# Patient Record
Sex: Male | Born: 1985 | Race: White | Hispanic: No | Marital: Single | Smoking: Current every day smoker
Health system: Southern US, Community
[De-identification: ages and names within clinical notes are randomized; demographics above are authoritative.]

## PROBLEM LIST (undated history)

## (undated) HISTORY — PX: TONSILLECTOMY AND ADENOIDECTOMY: SUR1326

---
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
## 2007-02-16 ENCOUNTER — Encounter: Payer: Self-pay | Admitting: Orthopedic Surgery

## 2007-03-13 ENCOUNTER — Emergency Department (HOSPITAL_COMMUNITY): Admission: EM | Admit: 2007-03-13 | Discharge: 2007-03-13 | Payer: Self-pay | Admitting: Emergency Medicine

## 2007-03-17 ENCOUNTER — Ambulatory Visit: Payer: Self-pay | Admitting: Orthopedic Surgery

## 2007-03-17 DIAGNOSIS — S62329A Displaced fracture of shaft of unspecified metacarpal bone, initial encounter for closed fracture: Secondary | ICD-10-CM | POA: Insufficient documentation

## 2007-04-14 ENCOUNTER — Ambulatory Visit: Payer: Self-pay | Admitting: Orthopedic Surgery

## 2009-08-25 IMAGING — CR DG HAND COMPLETE 3+V*R*
3 series · 3 of 3 positions shown · non-contrast
Comparison: None.

CLINICAL DATA: Punched wall ? pain and swelling 5th metacarpal.
 RIGHT HAND ? 3 VIEW:

[view not recorded (1 of 3)]
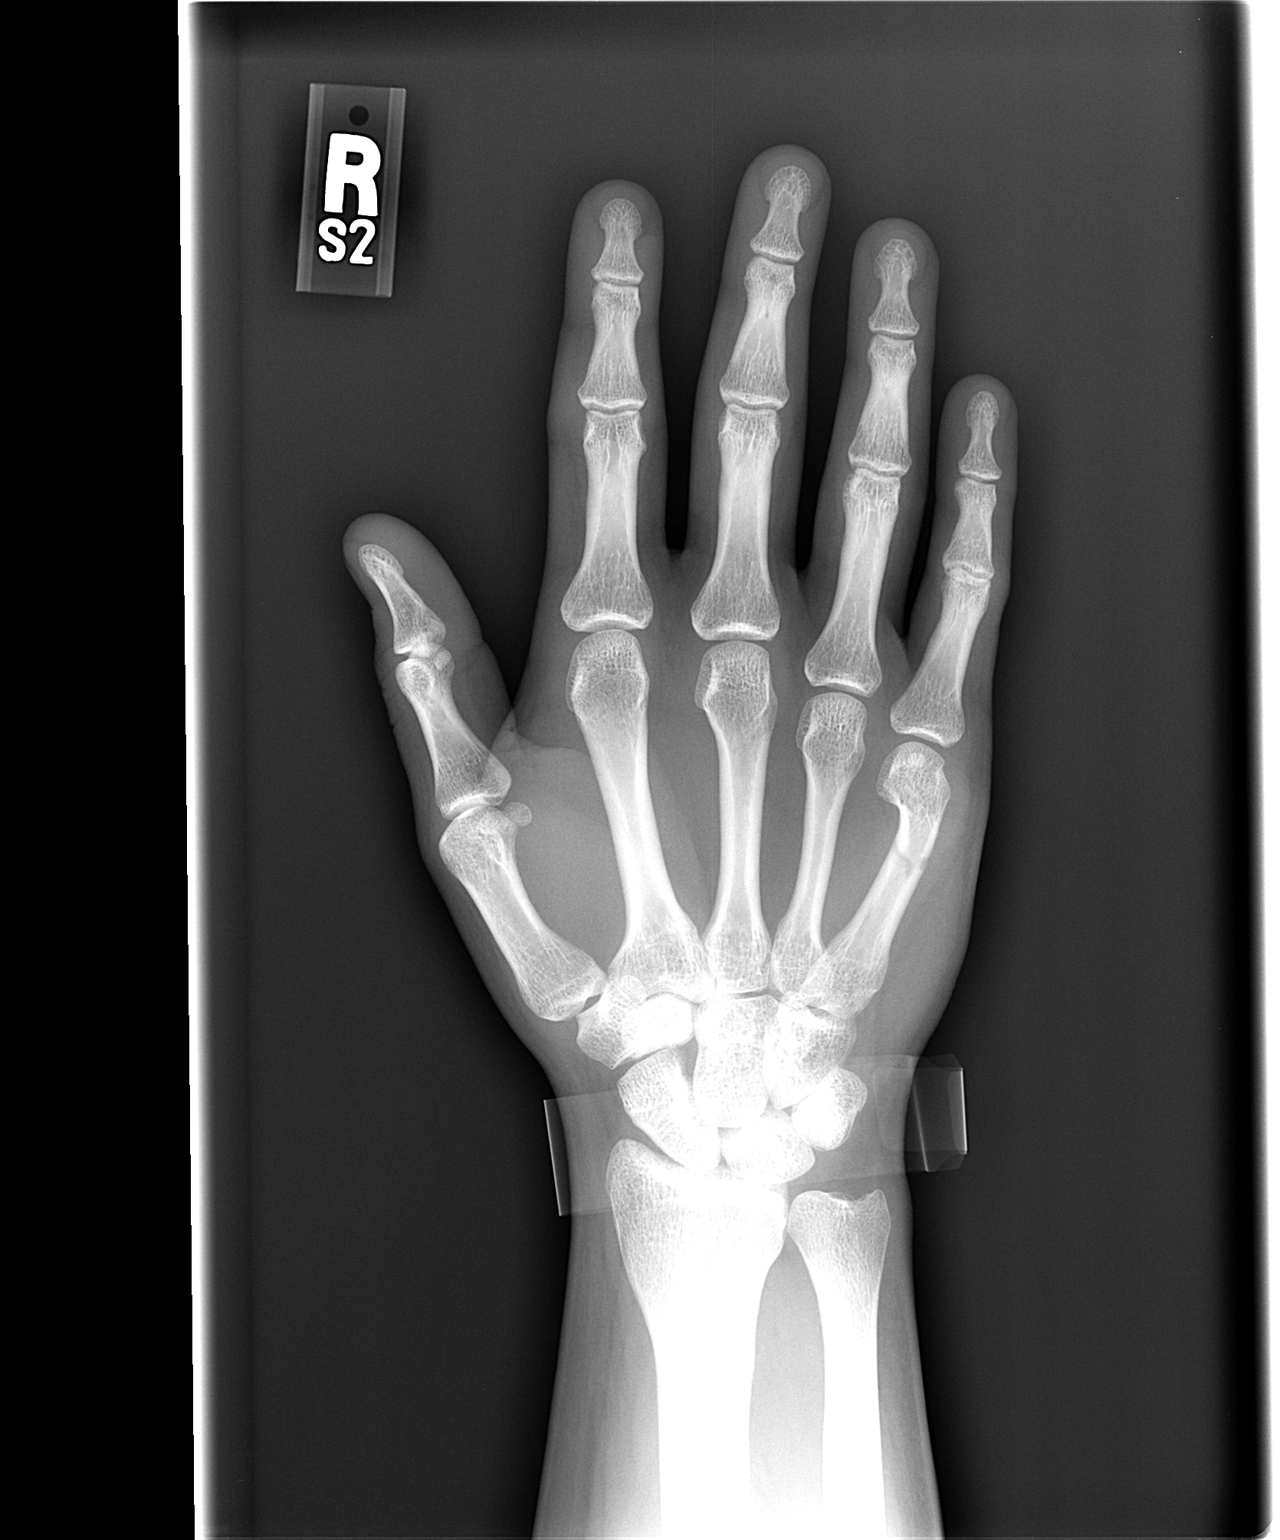

[view not recorded (2 of 3)]
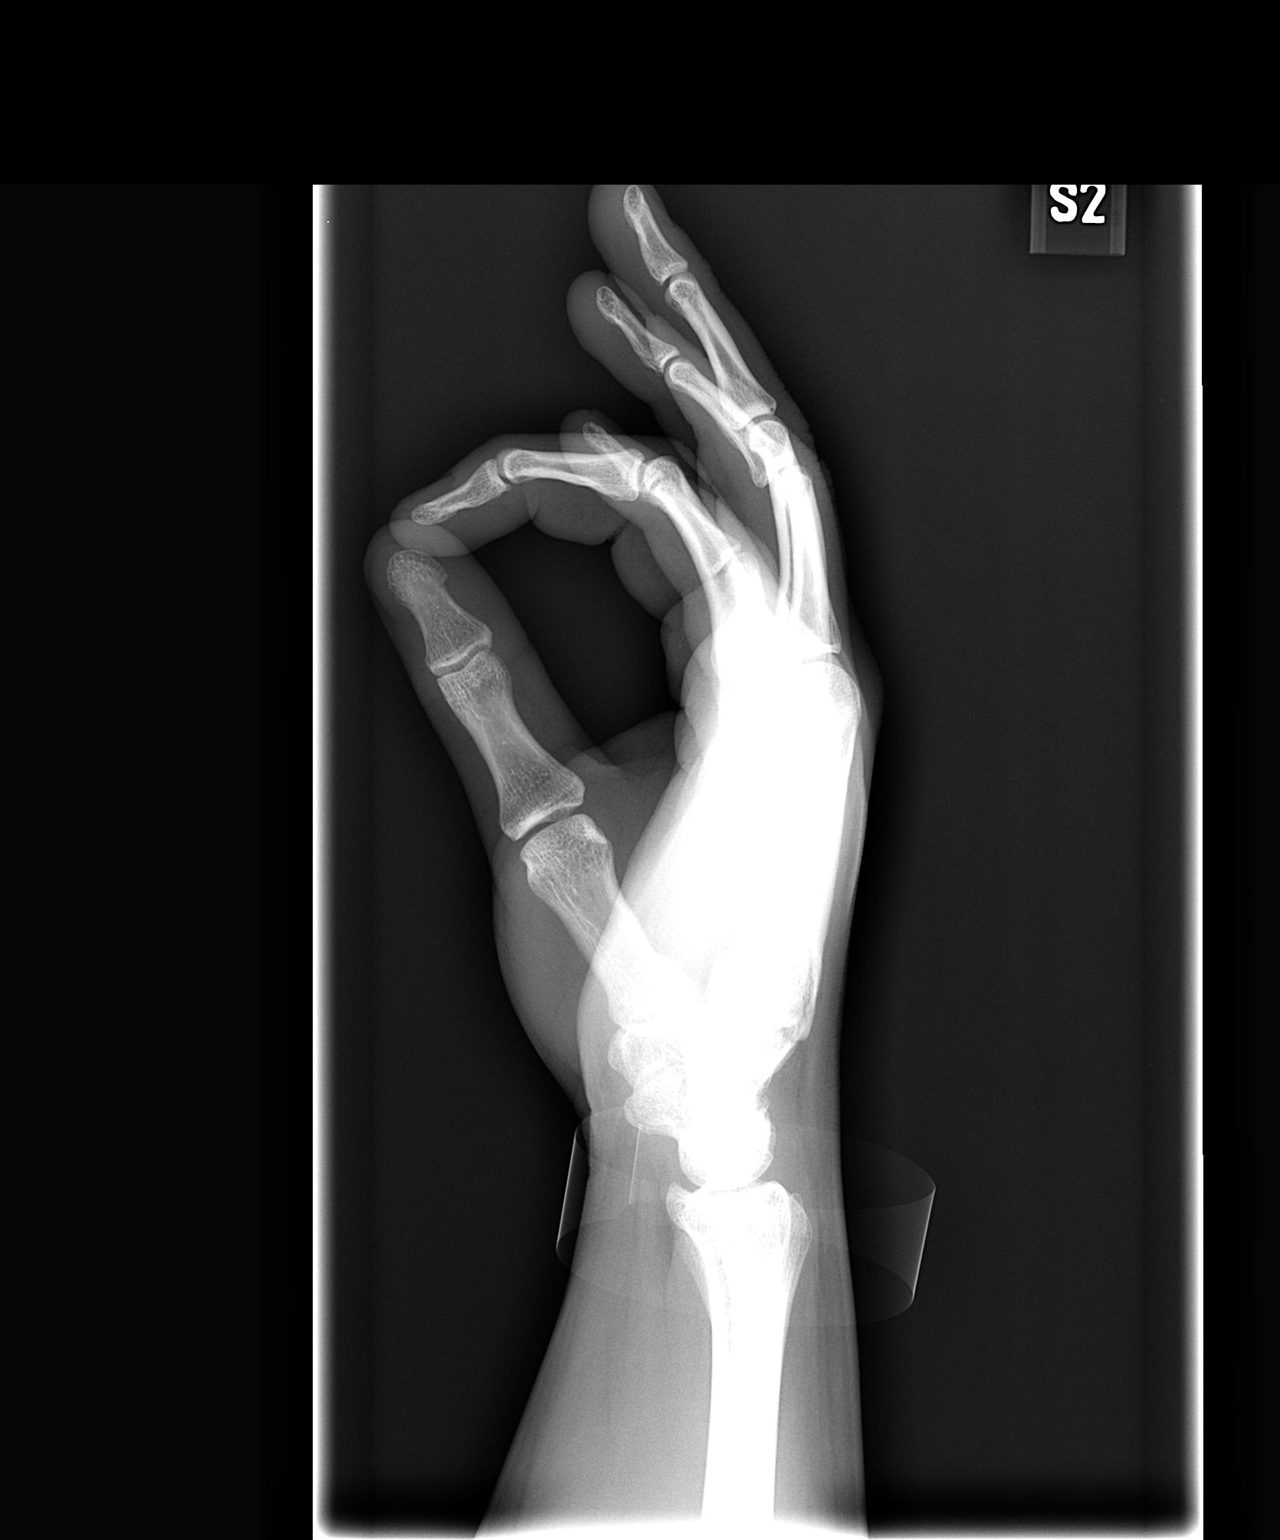

[view not recorded (3 of 3)]
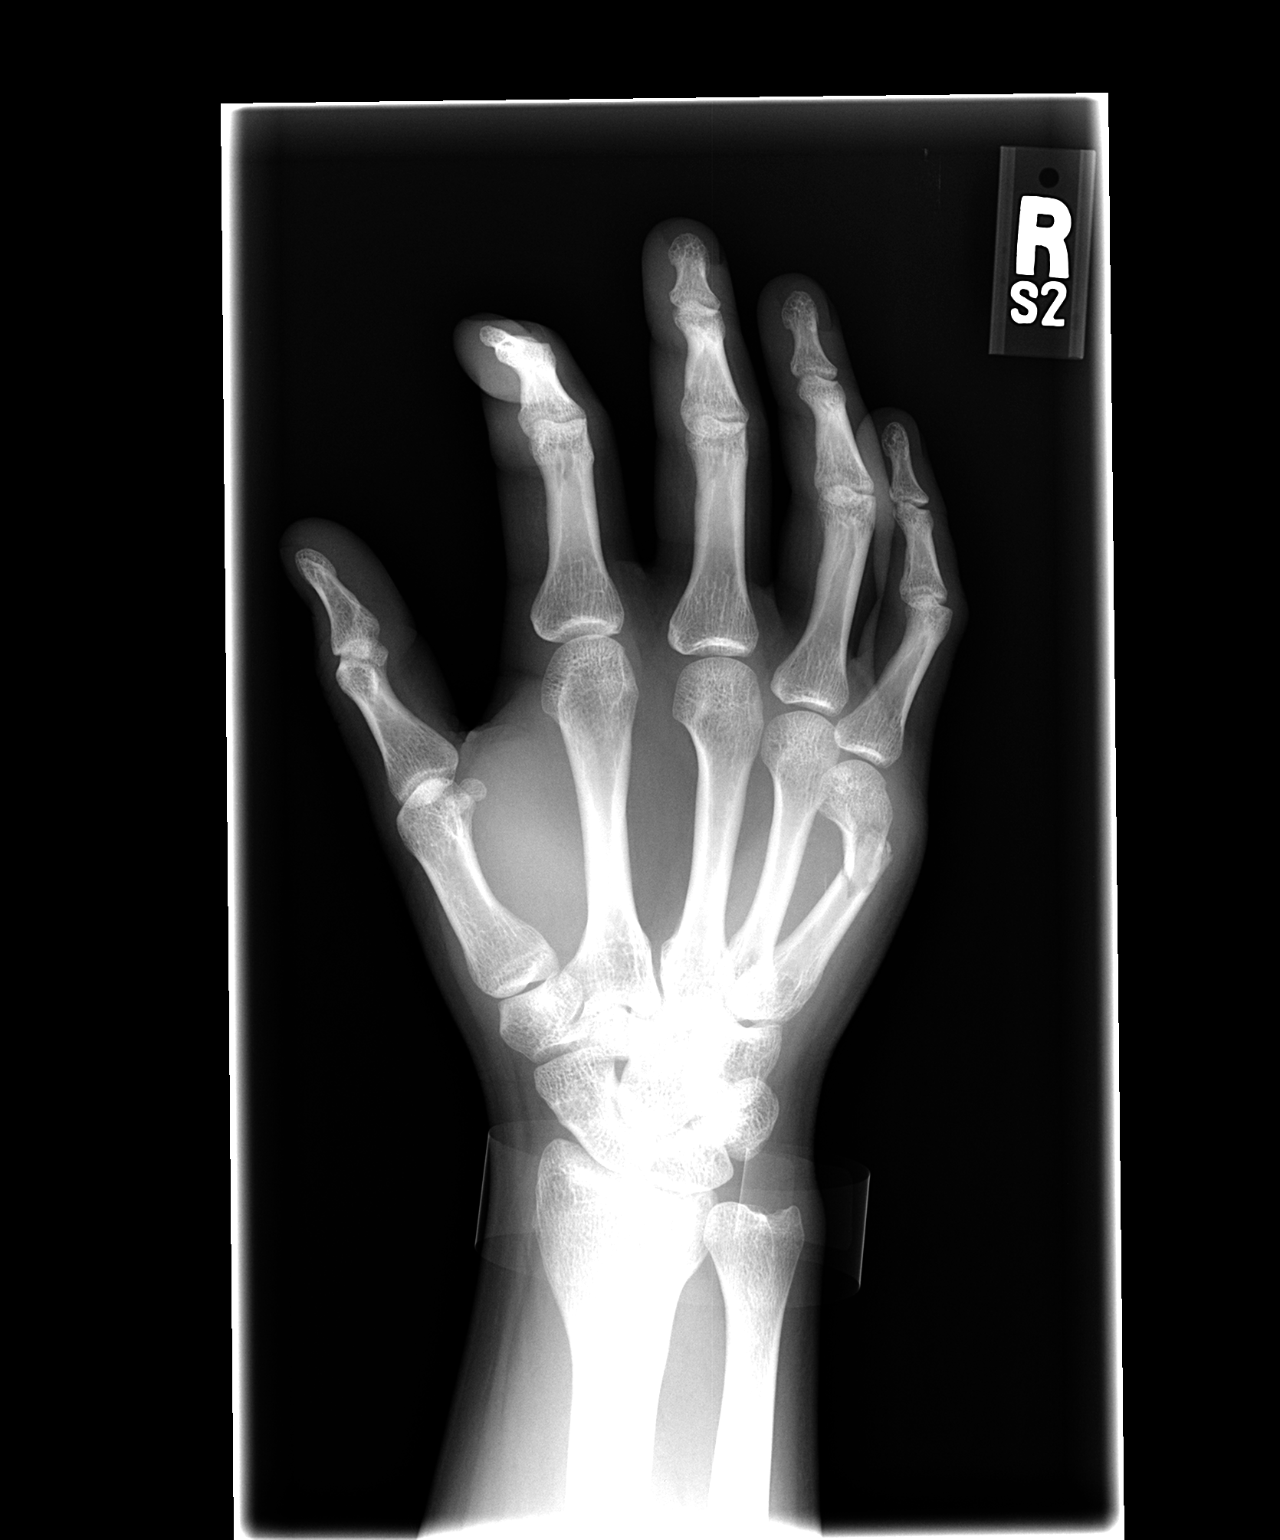

[3 of 3 positions shown; findings below may reference images not displayed]

FINDINGS: There is a boxer?s fracture of the distal 5th metacarpal with moderate angulation, as seen on the oblique view.
IMPRESSION: Boxer?s fracture of the 5th metacarpal.

## 2012-12-16 ENCOUNTER — Emergency Department (INDEPENDENT_AMBULATORY_CARE_PROVIDER_SITE_OTHER): Payer: BC Managed Care – PPO

## 2012-12-16 ENCOUNTER — Emergency Department (HOSPITAL_COMMUNITY)
Admission: EM | Admit: 2012-12-16 | Discharge: 2012-12-16 | Disposition: A | Discharge status: Home / Self Care | Payer: BC Managed Care – PPO | Source: Home / Self Care

## 2012-12-16 ENCOUNTER — Encounter (HOSPITAL_COMMUNITY): Payer: Self-pay | Admitting: Emergency Medicine

## 2012-12-16 DIAGNOSIS — S335XXA Sprain of ligaments of lumbar spine, initial encounter: Secondary | ICD-10-CM

## 2012-12-16 DIAGNOSIS — S39012A Strain of muscle, fascia and tendon of lower back, initial encounter: Secondary | ICD-10-CM

## 2012-12-16 MED ORDER — KETOROLAC TROMETHAMINE 30 MG/ML IJ SOLN
INTRAMUSCULAR | Status: AC
  Filled 2012-12-16: qty 1

## 2012-12-16 MED ORDER — CYCLOBENZAPRINE HCL 5 MG PO TABS: 5.0000 mg | ORAL_TABLET | Freq: Three times a day (TID) | ORAL | Status: DC | PRN

## 2012-12-16 MED ORDER — KETOROLAC TROMETHAMINE 10 MG PO TABS: 10.0000 mg | ORAL_TABLET | Freq: Four times a day (QID) | ORAL | Status: DC | PRN

## 2012-12-16 MED ORDER — KETOROLAC TROMETHAMINE 30 MG/ML IJ SOLN
30.0000 mg | Freq: Once | INTRAMUSCULAR | Status: AC
  Administered 2012-12-16: 30 mg via INTRAMUSCULAR

## 2012-12-16 NOTE — ED Notes (Signed)
Pt  Reports     Lower  Left  Sided  Back  Pain    Worse  On  Movement  And  Certain  posistions         denys  Any  specefic  Injury   Ambulated  With a  Slow  Guarded  Gait       Pt  States  He  Went to a  chiropractor    yest  And  Symptoms  Were  Not  releived

## 2012-12-16 NOTE — ED Provider Notes (Signed)
CSN: [id number]     Arrival date & time 12/16/12  1210 History   None    Chief Complaint  Patient presents with  . Back Pain   (Consider location/radiation/quality/duration/timing/severity/associated sxs/prior Treatment) Patient is a 27 y.o. male presenting with back pain. The history is provided by the patient.  Back Pain Location:  Lumbar spine Quality:  Shooting Radiates to:  Does not radiate Pain severity:  Moderate Onset quality:  Sudden Duration:  1 day Timing:  Constant Progression:  Worsening Chronicity:  New Context comment:  Onset while running, no gi or gu sx, no neuro sx. Relieved by:  Nothing Associated symptoms: no abdominal pain and no fever     History reviewed. No pertinent past medical history. History reviewed. No pertinent past surgical history. History reviewed. No pertinent family history. History  Substance Use Topics  . Smoking status: Current Every Day Smoker  . Smokeless tobacco: Not on file  . Alcohol Use: Yes    Review of Systems  Constitutional: Negative.  Negative for fever.  Gastrointestinal: Negative.  Negative for abdominal pain.  Musculoskeletal: Positive for back pain. Negative for gait problem, joint swelling and neck pain.  Skin: Negative.     Allergies  Penicillins  Home Medications   Current Outpatient Rx  Name  Route  Sig  Dispense  Refill  . cyclobenzaprine (FLEXERIL) 5 MG tablet   Oral   Take 1 tablet (5 mg total) by mouth 3 (three) times daily as needed for muscle spasms.   30 tablet   0   . ketorolac (TORADOL) 10 MG tablet   Oral   Take 1 tablet (10 mg total) by mouth every 6 (six) hours as needed. For back pain   20 tablet   1    BP 165/98  Pulse 81  Temp(Src) 97.8 F (36.6 C) (Oral)  Resp 16  SpO2 100% Physical Exam  Nursing note and vitals reviewed. Constitutional: He is oriented to person, place, and time. He appears well-developed and well-nourished.  Abdominal: Soft. Bowel sounds are normal.   Musculoskeletal: He exhibits tenderness.       Lumbar back: He exhibits decreased range of motion, tenderness and spasm. He exhibits no bony tenderness, no swelling and normal pulse.  Neg slr, nl ehl, nl heel and toe walking.  Neurological: He is alert and oriented to person, place, and time.  Skin: Skin is warm and dry.    ED Course  Procedures (including critical care time) Labs Review Labs Reviewed - No data to display Imaging Review No results found.  EKG Interpretation    Date/Time:    Ventricular Rate:    PR Interval:    QRS Duration:   QT Interval:    QTC Calculation:   R Axis:     Text Interpretation:              MDM  X-rays reviewed and report per radiologist.     Linna Hoff, MD 12/16/12 [phone]

## 2016-06-13 ENCOUNTER — Emergency Department (HOSPITAL_COMMUNITY): Payer: Managed Care, Other (non HMO)

## 2016-06-13 ENCOUNTER — Encounter (HOSPITAL_COMMUNITY): Payer: Self-pay | Admitting: *Deleted

## 2016-06-13 ENCOUNTER — Emergency Department (HOSPITAL_COMMUNITY)
Admission: EM | Admit: 2016-06-13 | Discharge: 2016-06-13 | Disposition: A | Discharge status: Home / Self Care | Payer: Managed Care, Other (non HMO) | Attending: Emergency Medicine | Admitting: Emergency Medicine

## 2016-06-13 DIAGNOSIS — W010XXA Fall on same level from slipping, tripping and stumbling without subsequent striking against object, initial encounter: Secondary | ICD-10-CM | POA: Diagnosis not present

## 2016-06-13 DIAGNOSIS — S99912A Unspecified injury of left ankle, initial encounter: Secondary | ICD-10-CM | POA: Diagnosis present

## 2016-06-13 DIAGNOSIS — Y929 Unspecified place or not applicable: Secondary | ICD-10-CM | POA: Diagnosis not present

## 2016-06-13 DIAGNOSIS — Y9367 Activity, basketball: Secondary | ICD-10-CM | POA: Diagnosis not present

## 2016-06-13 DIAGNOSIS — F17200 Nicotine dependence, unspecified, uncomplicated: Secondary | ICD-10-CM | POA: Insufficient documentation

## 2016-06-13 DIAGNOSIS — Z79899 Other long term (current) drug therapy: Secondary | ICD-10-CM | POA: Insufficient documentation

## 2016-06-13 DIAGNOSIS — S93402A Sprain of unspecified ligament of left ankle, initial encounter: Secondary | ICD-10-CM | POA: Insufficient documentation

## 2016-06-13 DIAGNOSIS — Y998 Other external cause status: Secondary | ICD-10-CM | POA: Diagnosis not present

## 2016-06-13 NOTE — ED Triage Notes (Signed)
Pt c/o left ankle pain after twisted his left ankle,

## 2016-06-13 NOTE — Discharge Instructions (Signed)
Your x-ray is negative for fracture or dislocation. Your examination favors a strain/sprain of your left ankle. Please use your ankle splint when up and about. Apply ice over the next 2-3 days. Use 600-800 mg of ibuprofen every 6 hours. May use Tylenol in between the ibuprofen doses. Please see your primary physician for additional evaluation if not improving.

## 2016-06-13 NOTE — ED Provider Notes (Signed)
[facility] DEPT Provider Note   CSN: [id number] Arrival date & time: 06/13/16  2111     History   Chief Complaint Chief Complaint  Patient presents with  . Foot Pain    HPI Kyle Dillon is a 31 y.o. male.  Patient is a 31 year old male who presents to the emergency department with a complaint of left ankle pain.  The patient states he was playing basketball. He came down at [facility] position and injured his left ankle extending into his left foot. He has not had any previous operations or procedures involving his left lower extremity. He has pain when he attempts to put weight on it. He has a friend who put the ankle stirrup splint on his foot and gave him ice. He states this helped some but he still has some pain with it. He presents now for assessment of this injury.      History reviewed. No pertinent past medical history.  Patient Active Problem List   Diagnosis Date Noted  . CLOSED FRACTURE OF SHAFT OF METACARPAL BONE 03/17/2007    Past Surgical History:  Procedure Laterality Date  . TONSILLECTOMY AND ADENOIDECTOMY         Home Medications    Prior to Admission medications   Medication Sig Start Date End Date Taking? Authorizing Provider  cyclobenzaprine (FLEXERIL) 5 MG tablet Take 1 tablet (5 mg total) by mouth 3 (three) times daily as needed for muscle spasms. 12/16/12   Linna Hoff, MD  ketorolac (TORADOL) 10 MG tablet Take 1 tablet (10 mg total) by mouth every 6 (six) hours as needed. For back pain 12/16/12   Linna Hoff, MD    Family History No family history on file.  Social History Social History  Substance Use Topics  . Smoking status: Current Every Day Smoker  . Smokeless tobacco: Never Used  . Alcohol use Yes     Allergies   Penicillins   Review of Systems Review of Systems  Constitutional: Negative for activity change.       All ROS Neg except as noted in HPI  HENT: Negative for nosebleeds.   Eyes: Negative for  photophobia and discharge.  Respiratory: Negative for cough, shortness of breath and wheezing.   Cardiovascular: Negative for chest pain and palpitations.  Gastrointestinal: Negative for abdominal pain and blood in stool.  Genitourinary: Negative for dysuria, frequency and hematuria.  Musculoskeletal: Positive for arthralgias. Negative for back pain and neck pain.       Ankle pain  Skin: Negative.   Neurological: Negative for dizziness, seizures and speech difficulty.  Psychiatric/Behavioral: Negative for confusion and hallucinations.     Physical Exam Updated Vital Signs BP 136/75   Pulse 81   Temp 98.5 F (36.9 C) (Oral)   Resp 18   Ht 5\' 11"  (1.803 m)   Wt 90.7 kg (200 lb)   SpO2 100%   BMI 27.89 kg/m   Physical Exam  Constitutional: He is oriented to person, place, and time. He appears well-developed and well-nourished.  Non-toxic appearance.  HENT:  Head: Normocephalic.  Right Ear: Tympanic membrane and external ear normal.  Left Ear: Tympanic membrane and external ear normal.  Eyes: EOM and lids are normal. Pupils are equal, round, and reactive to light.  Neck: Normal range of motion. Neck supple. Carotid bruit is not present.  Cardiovascular: Normal rate, regular rhythm, normal heart sounds, intact distal pulses and normal pulses.   Pulmonary/Chest: Breath sounds normal. No respiratory distress.  Abdominal: Soft. Bowel sounds are normal. There is no tenderness. There is no guarding.  Musculoskeletal: He exhibits tenderness.       Left foot: There is tenderness. There is normal capillary refill.       Feet:  Dorsalis pedis is 2+. Bruise discoloration at the lateral malleolus. Capillary refill is less than 2 seconds. Achilles tendon is intact. No lesions noted between the toes.  Lymphadenopathy:       Head (right side): No submandibular adenopathy present.       Head (left side): No submandibular adenopathy present.    He has no cervical adenopathy.  Neurological:  He is alert and oriented to person, place, and time. He has normal strength. No cranial nerve deficit or sensory deficit.  Skin: Skin is warm and dry.  Psychiatric: He has a normal mood and affect. His speech is normal.  Nursing note and vitals reviewed.    ED Treatments / Results  Labs (all labs ordered are listed, but only abnormal results are displayed) Labs Reviewed - No data to display  EKG  EKG Interpretation None       Radiology No results found.  Procedures Procedures (including critical care time)  Medications Ordered in ED Medications - No data to display   Initial Impression / Assessment and Plan / ED Course  I have reviewed the triage vital signs and the nursing notes.  Pertinent labs & imaging results that were available during my care of the patient were reviewed by me and considered in my medical decision making (see chart for details).       Final Clinical Impressions(s) / ED Diagnoses MDM Vital signs stable. Pain of the lateral malleolus. Xray reveals No fracture and no dislocation. There is no joint effusion appreciated on the ankle.  Patient takes an ankle stirrup type splint. He is asked to keep his foot elevated and apply ice. The patient has pain with flexion and extension. The patient will use anti-inflammatory pain medication. I have asked the patient not to attempt excessive standing, climbing ladders, or walking for extended period of time. Work note has been given to the patient.    Final diagnoses:  Sprain of left ankle, unspecified ligament, initial encounter    New Prescriptions New Prescriptions   No medications on file     Duayne CalBryant, Jillianne Gamino, PA-C 06/15/16 1132    Bethann BerkshireZammit, Joseph, MD 06/15/16 2319

## 2018-11-26 IMAGING — DX DG ANKLE COMPLETE 3+V*L*
3 series · 3 of 3 positions shown · non-contrast
Comparison: None.

CLINICAL DATA: Lateral ankle pain

EXAM:
LEFT ANKLE COMPLETE - 3+ VIEW

[ankle ap]
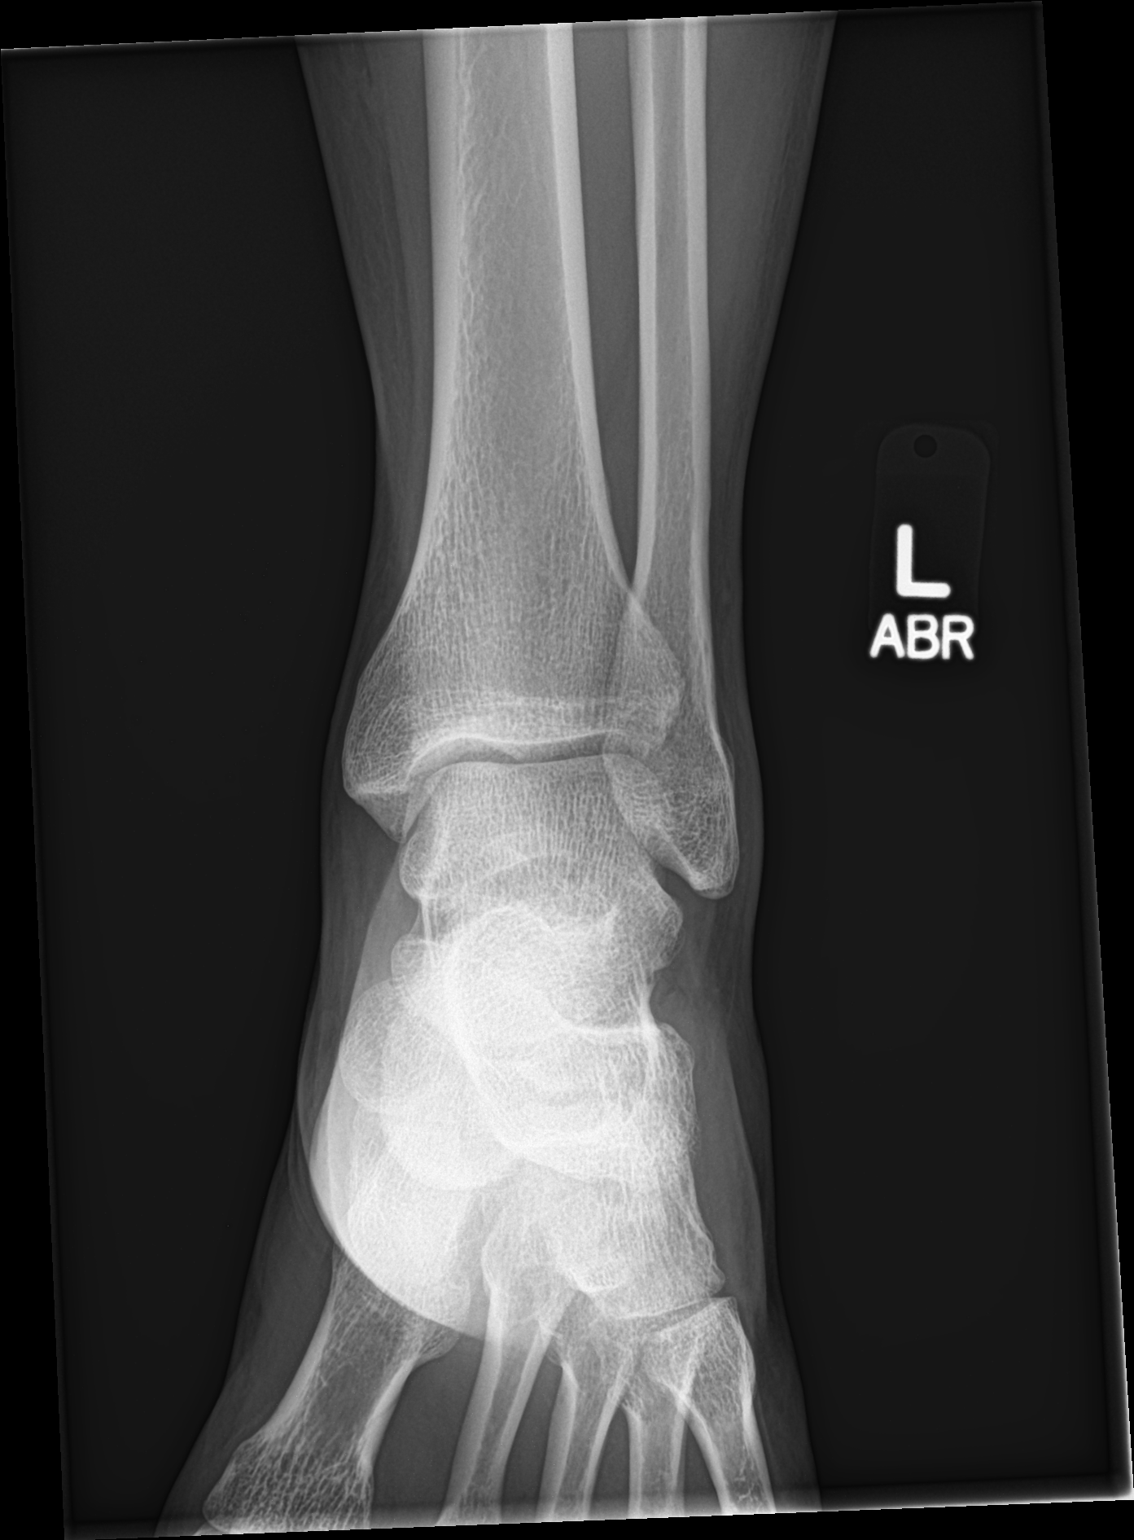

[ankle obl]
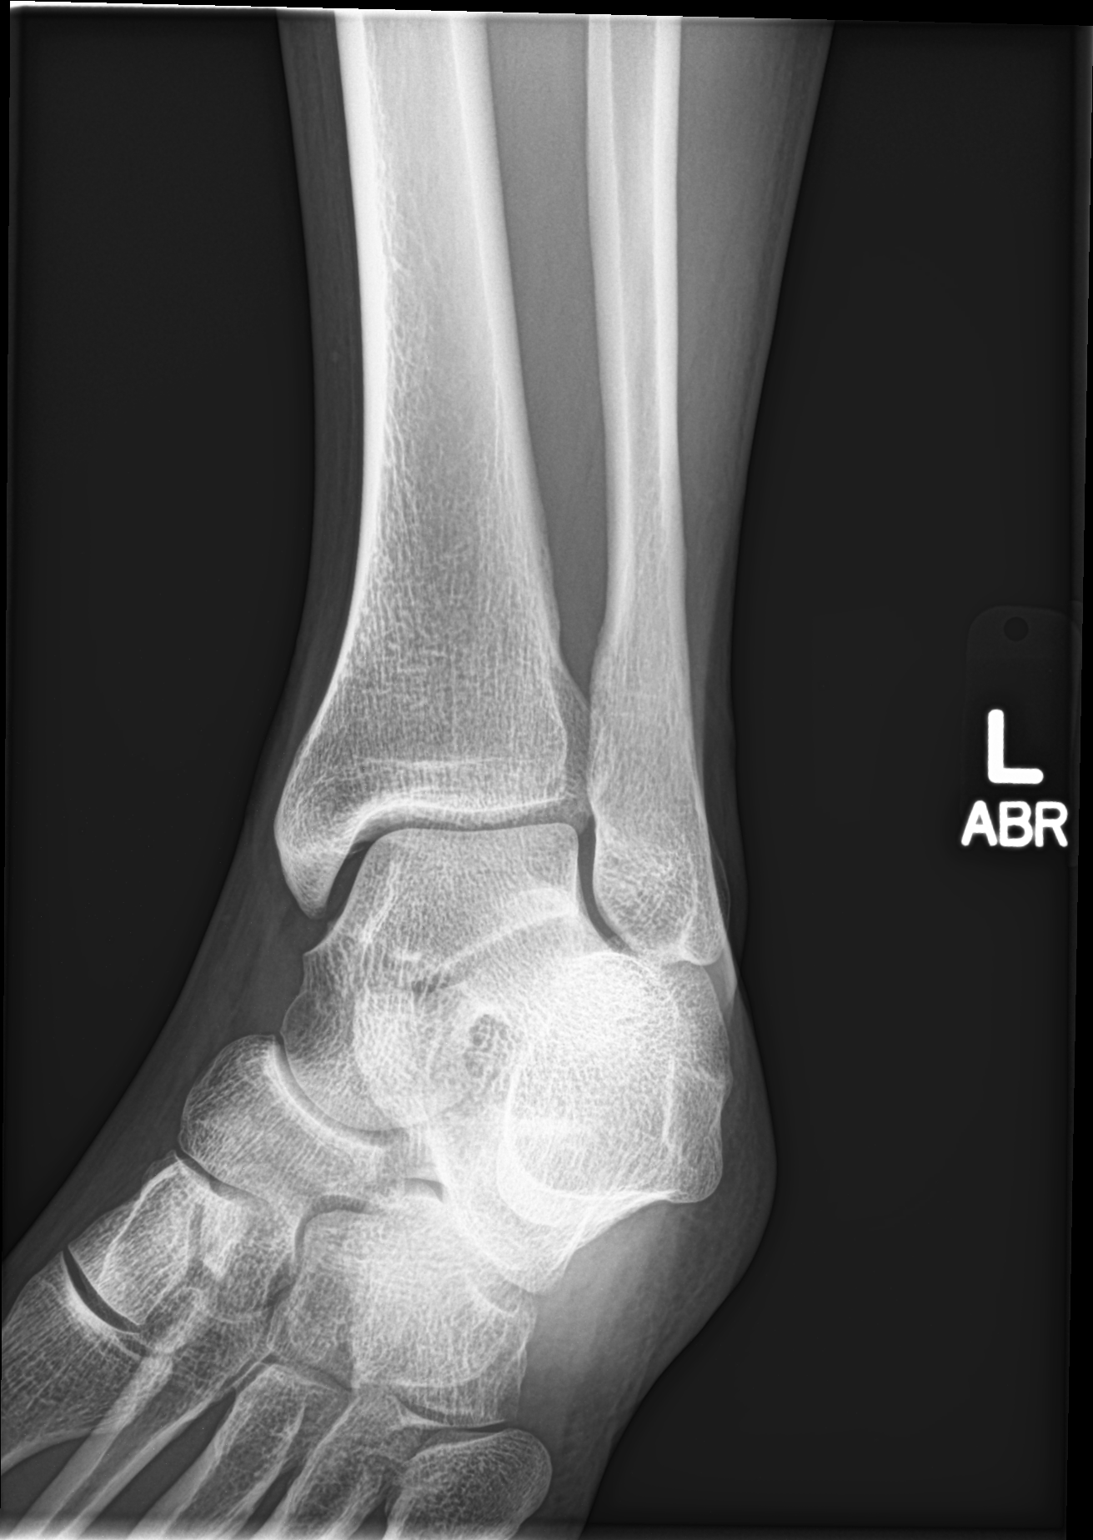

[ankle lat]
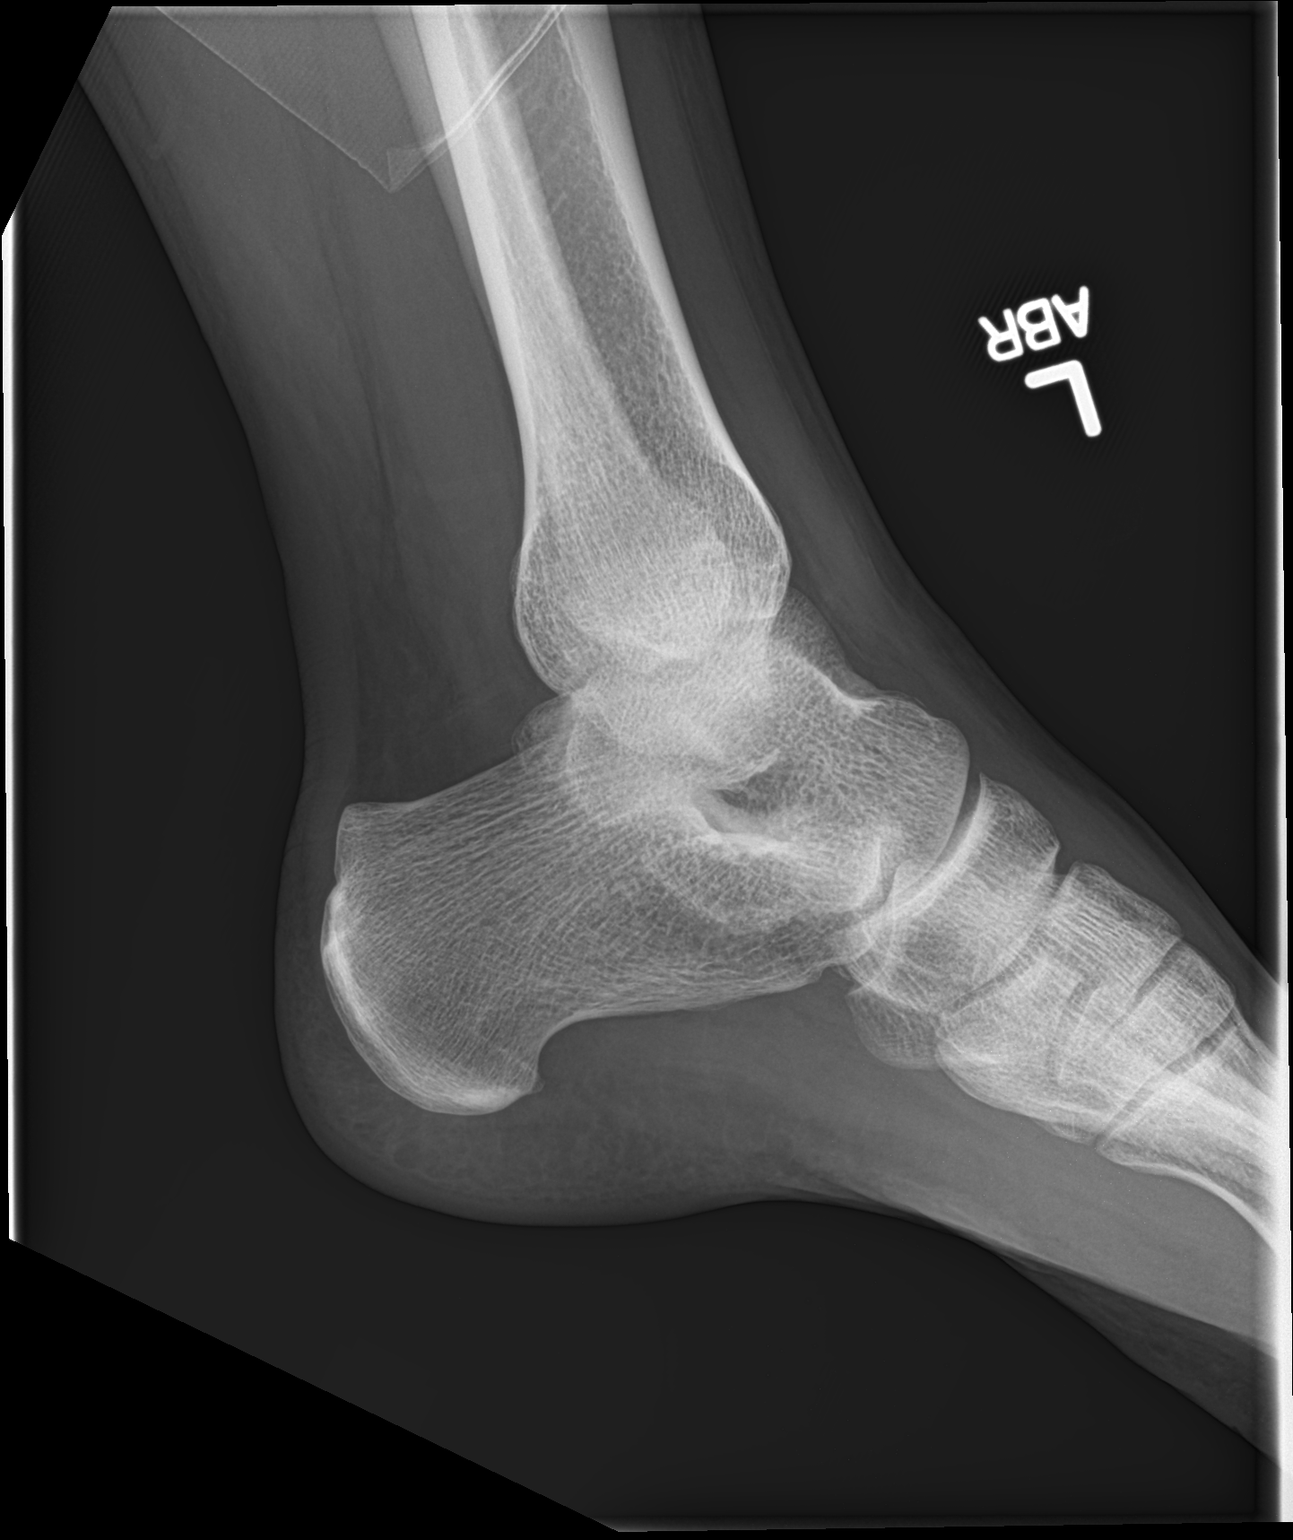

[3 of 3 positions shown; findings below may reference images not displayed]

FINDINGS: There is no evidence of fracture, dislocation, or joint effusion.
There is no evidence of arthropathy or other focal bone abnormality.
Soft tissues are unremarkable.
IMPRESSION: Negative.

## 2018-12-23 DIAGNOSIS — Z0000 Encounter for general adult medical examination without abnormal findings: Secondary | ICD-10-CM | POA: Diagnosis not present

## 2018-12-23 DIAGNOSIS — E782 Mixed hyperlipidemia: Secondary | ICD-10-CM | POA: Diagnosis not present

## 2018-12-23 DIAGNOSIS — Z113 Encounter for screening for infections with a predominantly sexual mode of transmission: Secondary | ICD-10-CM | POA: Diagnosis not present

## 2018-12-23 DIAGNOSIS — E663 Overweight: Secondary | ICD-10-CM | POA: Diagnosis not present

## 2018-12-23 DIAGNOSIS — Z6829 Body mass index (BMI) 29.0-29.9, adult: Secondary | ICD-10-CM | POA: Diagnosis not present

## 2019-05-08 ENCOUNTER — Other Ambulatory Visit: Payer: Self-pay

## 2019-05-08 ENCOUNTER — Ambulatory Visit
Admission: EM | Admit: 2019-05-08 | Discharge: 2019-05-08 | Disposition: A | Discharge status: Home / Self Care | Payer: BC Managed Care – PPO | Attending: Emergency Medicine | Admitting: Emergency Medicine

## 2019-05-08 DIAGNOSIS — B9689 Other specified bacterial agents as the cause of diseases classified elsewhere: Secondary | ICD-10-CM

## 2019-05-08 DIAGNOSIS — J019 Acute sinusitis, unspecified: Secondary | ICD-10-CM

## 2019-05-08 MED ORDER — PREDNISONE 10 MG PO TABS: 20.0000 mg | ORAL_TABLET | Freq: Every day | ORAL | 0 refills | Status: DC

## 2019-05-08 MED ORDER — AZITHROMYCIN 250 MG PO TABS: 250.0000 mg | ORAL_TABLET | Freq: Every day | ORAL | 0 refills | Status: DC

## 2019-05-08 NOTE — Discharge Instructions (Addendum)
Rest and push fluids Azithromycin prescribed.  Use daily for symptomatic relief Continue to take Flonase and Mucinex as prescribed Continue with OTC ibuprofen/tylenol as needed for pain Follow up with PCP or [facility] if symptoms persists Return or go to the ED if you have any new or worsening symptoms such as fever, chills, worsening sinus pain/pressure, cough, sore throat, chest pain, shortness of breath, abdominal pain, changes in bowel or bladder habits, etc..Marland Kitchen

## 2019-05-08 NOTE — ED Provider Notes (Addendum)
[facility]   [id number] 05/08/19 Arrival Time: 0911   CC: Sinusitis  SUBJECTIVE: History from: patient.  Kyle Dillon is a 34 y.o. male who presents with with a complaint of sinus pressure, sinus pain with brown mucus for the past 7 to 10 days.  Denies sick exposure to COVID, flu or strep.  Denies recent travel.  Has tried OTC Mucinex and Flonase nasal spray without relief.  Symptoms are made worse with lying down.  Denies previous symptoms in the past.   Denies fever, chills, fatigue,  rhinorrhea, sore throat, SOB, wheezing, chest pain, nausea, changes in bowel or bladder habits.        Received flu shot this year: yes.  ROS: As per HPI.  All other pertinent ROS negative.     History reviewed. No pertinent past medical history. Past Surgical History:  Procedure Laterality Date  . TONSILLECTOMY AND ADENOIDECTOMY     Allergies  Allergen Reactions  . Penicillins    No current facility-administered medications on file prior to encounter.   Current Outpatient Medications on File Prior to Encounter  Medication Sig Dispense Refill  . cyclobenzaprine (FLEXERIL) 5 MG tablet Take 1 tablet (5 mg total) by mouth 3 (three) times daily as needed for muscle spasms. 30 tablet 0  . ketorolac (TORADOL) 10 MG tablet Take 1 tablet (10 mg total) by mouth every 6 (six) hours as needed. For back pain 20 tablet 1   Social History   Socioeconomic History  . Marital status: Single    Spouse name: Not on file  . Number of children: Not on file  . Years of education: Not on file  . Highest education level: Not on file  Occupational History  . Not on file  Tobacco Use  . Smoking status: Current Every Day Smoker  . Smokeless tobacco: Never Used  Substance and Sexual Activity  . Alcohol use: Yes  . Drug use: Not on file  . Sexual activity: Not on file  Other Topics Concern  . Not on file  Social History Narrative  . Not on file   Social Determinants of Health   Financial  Resource Strain:   . Difficulty of Paying Living Expenses:   Food Insecurity:   . Worried About Programme researcher, broadcasting/film/video in the Last Year:   . Barista in the Last Year:   Transportation Needs:   . Freight forwarder (Medical):   Marland Kitchen Lack of Transportation (Non-Medical):   Physical Activity:   . Days of Exercise per Week:   . Minutes of Exercise per Session:   Stress:   . Feeling of Stress :   Social Connections:   . Frequency of Communication with Friends and Family:   . Frequency of Social Gatherings with Friends and Family:   . Attends Religious Services:   . Active Member of Clubs or Organizations:   . Attends Banker Meetings:   Marland Kitchen Marital Status:   Intimate Partner Violence:   . Fear of Current or Ex-Partner:   . Emotionally Abused:   Marland Kitchen Physically Abused:   . Sexually Abused:    History reviewed. No pertinent family history.  OBJECTIVE:  Vitals:   05/08/19 0923  BP: 134/80  Pulse: 78  Resp: 16  Temp: 98.2 F (36.8 C)  TempSrc: Oral  SpO2: 99%     General appearance: alert; appears fatigued, but nontoxic; speaking in full sentences and tolerating own secretions HEENT: NCAT; Ears: EACs clear, TMs  pearly gray; Eyes: PERRL.  EOM grossly intact. Sinuses: tender maxillary sinuses; Nose: nares patent without rhinorrhea, Throat: oropharynx clear, tonsils non erythematous or enlarged, uvula midline  Neck: supple without LAD Lungs: unlabored respirations, symmetrical air entry; cough: absent; no respiratory distress; CTAB Heart: regular rate and rhythm.  Radial pulses 2+ symmetrical bilaterally Skin: warm and dry Psychological: alert and cooperative; normal mood and affect  LABS:  No results found for this or any previous visit (from the past 24 hour(s)).   ASSESSMENT & PLAN:  1. Acute bacterial sinusitis     Meds ordered this encounter  Medications  . DISCONTD: azithromycin (ZITHROMAX) 250 MG tablet    Sig: Take 1 tablet (250 mg total) by  mouth daily. Take first 2 tablets together, then 1 every day until finished.    Dispense:  6 tablet    Refill:  0  . DISCONTD: predniSONE (DELTASONE) 10 MG tablet    Sig: Take 2 tablets (20 mg total) by mouth daily.    Dispense:  15 tablet    Refill:  0  . DISCONTD: azithromycin (ZITHROMAX) 250 MG tablet    Sig: Take 1 tablet (250 mg total) by mouth daily. Take first 2 tablets together, then 1 every day until finished.    Dispense:  6 tablet    Refill:  0  . DISCONTD: predniSONE (DELTASONE) 10 MG tablet    Sig: Take 2 tablets (20 mg total) by mouth daily.    Dispense:  15 tablet    Refill:  0    Discharge instruction Rest and push fluids Azithromycin prescribed.  Use daily for symptomatic relief Continue to take Flonase and Mucinex as prescribed Continue with OTC ibuprofen/tylenol as needed for pain Follow up with PCP or [facility] if symptoms persists Return or go to the ED if you have any new or worsening symptoms such as fever, chills, worsening sinus pain/pressure, cough, sore throat, chest pain, shortness of breath, abdominal pain, changes in bowel or bladder habits, etc...   Reviewed expectations re: course of current medical issues. Questions answered. Outlined signs and symptoms indicating need for more acute intervention. Patient verbalized understanding. After Visit Summary given.         Emerson Monte, FNP 05/08/19 0955    Emerson Monte, FNP 05/08/19 1001

## 2019-05-08 NOTE — ED Triage Notes (Signed)
Pt has c/o sinus congestion for over a week

## 2020-01-07 DIAGNOSIS — M25511 Pain in right shoulder: Secondary | ICD-10-CM | POA: Diagnosis not present

## 2020-05-26 ENCOUNTER — Encounter: Payer: Self-pay | Admitting: Emergency Medicine

## 2020-05-26 ENCOUNTER — Other Ambulatory Visit: Payer: Self-pay

## 2020-05-26 ENCOUNTER — Ambulatory Visit
Admission: EM | Admit: 2020-05-26 | Discharge: 2020-05-26 | Disposition: A | Discharge status: Home / Self Care | Payer: BC Managed Care – PPO | Attending: Emergency Medicine | Admitting: Emergency Medicine

## 2020-05-26 DIAGNOSIS — R6889 Other general symptoms and signs: Secondary | ICD-10-CM | POA: Diagnosis not present

## 2020-05-26 MED ORDER — ONDANSETRON HCL 4 MG PO TABS: 4.0000 mg | ORAL_TABLET | Freq: Four times a day (QID) | ORAL | 0 refills | Status: AC

## 2020-05-26 MED ORDER — ONDANSETRON 4 MG PO TBDP
4.0000 mg | ORAL_TABLET | Freq: Once | ORAL | Status: AC
  Administered 2020-05-26: 4 mg via ORAL

## 2020-05-26 NOTE — Discharge Instructions (Signed)
Declines covid test Get plenty of rest and push fluids zofran for nausea and vomiting Use OTC zyrtec for nasal congestion, runny nose, and/or sore throat Use OTC flonase for nasal congestion and runny nose Use medications daily for symptom relief Use OTC medications like ibuprofen or tylenol as needed fever or pain Call or go to the ED if you have any new or worsening symptoms such as fever, cough, shortness of breath, chest tightness, chest pain, turning blue, changes in mental status, etc..Marland Kitchen

## 2020-05-26 NOTE — ED Provider Notes (Signed)
[facility]   [id number] 05/26/20 Arrival Time: 7591   CC: flu symptoms  SUBJECTIVE: History from: patient.  SAMAJ WESSELLS is a 35 y.o. male who presents with N/V, congestion, and body aches x 24-36 hours.  Denies sick exposure to COVID, flu or strep.  Has tried OTC medications without relief.  Symptoms are made worse with eating.  Denies previous symptoms in the past.   Denies sinus pain, rhinorrhea, sore throat, SOB, wheezing, chest pain, changes in bowel or bladder habits.    At home covid test negative  ROS: As per HPI.  All other pertinent ROS negative.     History reviewed. No pertinent past medical history. Past Surgical History:  Procedure Laterality Date  . TONSILLECTOMY AND ADENOIDECTOMY     Allergies  Allergen Reactions  . Penicillins    No current facility-administered medications on file prior to encounter.   No current outpatient medications on file prior to encounter.   Social History   Socioeconomic History  . Marital status: Single    Spouse name: Not on file  . Number of children: Not on file  . Years of education: Not on file  . Highest education level: Not on file  Occupational History  . Not on file  Tobacco Use  . Smoking status: Current Every Day Smoker  . Smokeless tobacco: Never Used  Substance and Sexual Activity  . Alcohol use: Yes  . Drug use: Not on file  . Sexual activity: Not on file  Other Topics Concern  . Not on file  Social History Narrative  . Not on file   Social Determinants of Health   Financial Resource Strain: Not on file  Food Insecurity: Not on file  Transportation Needs: Not on file  Physical Activity: Not on file  Stress: Not on file  Social Connections: Not on file  Intimate Partner Violence: Not on file   No family history on file.  OBJECTIVE:  Vitals:   05/26/20 0945  BP: (!) 151/80  Pulse: 86  Resp: 19  Temp: 99.7 F (37.6 C)  TempSrc: Oral  SpO2: 98%     General appearance: alert;  appears fatigued, but nontoxic; speaking in full sentences and tolerating own secretions HEENT: NCAT; Ears: EACs clear, TMs pearly gray; Eyes: PERRL.  EOM grossly intact. Nose: nares patent without rhinorrhea, Throat: oropharynx clear, tonsils non erythematous or enlarged, uvula midline  Neck: supple without LAD Lungs: unlabored respirations, symmetrical air entry; cough: absent; no respiratory distress; CTAB Heart: regular rate and rhythm.  Radial pulses 2+ symmetrical bilaterally Skin: warm and dry Psychological: alert and cooperative; normal mood and affect   ASSESSMENT & PLAN:  1. Flu-like symptoms     Meds ordered this encounter  Medications  . ondansetron (ZOFRAN) 4 MG tablet    Sig: Take 1 tablet (4 mg total) by mouth every 6 (six) hours.    Dispense:  12 tablet    Refill:  0    Order Specific Question:   Supervising Provider    Answer:   Eustace Moore [[id number]]  . ondansetron (ZOFRAN-ODT) disintegrating tablet 4 mg   Declines covid test Get plenty of rest and push fluids zofran for nausea and vomiting Use OTC zyrtec for nasal congestion, runny nose, and/or sore throat Use OTC flonase for nasal congestion and runny nose Use medications daily for symptom relief Use OTC medications like ibuprofen or tylenol as needed fever or pain Call or go to the ED if you have any  new or worsening symptoms such as fever, cough, shortness of breath, chest tightness, chest pain, turning blue, changes in mental status, etc...   Reviewed expectations re: course of current medical issues. Questions answered. Outlined signs and symptoms indicating need for more acute intervention. Patient verbalized understanding. After Visit Summary given.         Rennis Harding, PA-C 05/26/20 1021

## 2020-05-26 NOTE — ED Triage Notes (Addendum)
N/V, congestion and body aches for past 24-36 hours.  Neg at home covid test yesterday

## 2020-09-29 ENCOUNTER — Other Ambulatory Visit: Payer: Self-pay

## 2020-09-29 ENCOUNTER — Ambulatory Visit
Admission: EM | Admit: 2020-09-29 | Discharge: 2020-09-29 | Disposition: A | Discharge status: Home / Self Care | Payer: BC Managed Care – PPO | Attending: Emergency Medicine | Admitting: Emergency Medicine

## 2020-09-29 ENCOUNTER — Encounter: Payer: Self-pay | Admitting: Emergency Medicine

## 2020-09-29 DIAGNOSIS — R300 Dysuria: Secondary | ICD-10-CM | POA: Diagnosis not present

## 2020-09-29 LAB — POCT URINALYSIS DIP (MANUAL ENTRY)
Bilirubin, UA: NEGATIVE
Blood, UA: NEGATIVE
Glucose, UA: NEGATIVE mg/dL
Ketones, POC UA: NEGATIVE mg/dL
Leukocytes, UA: NEGATIVE
Nitrite, UA: NEGATIVE
Protein Ur, POC: NEGATIVE mg/dL
Spec Grav, UA: 1.02 (ref 1.010–1.025)
Urobilinogen, UA: 0.2 E.U./dL
pH, UA: 5.5 (ref 5.0–8.0)

## 2020-09-29 NOTE — ED Provider Notes (Signed)
[facility]    CSN: [id number] Arrival date & time: 09/29/20  5621      History   Chief Complaint No chief complaint on file.   HPI Kyle Dillon is a 35 y.o. male.   Pt. C/o increased urination with burning for one week. OTC medication for pain without relief. He states that he has some right flank pain also. No fever, chills or penile d/c. Positive family hx. Of kidney stones.     History reviewed. No pertinent past medical history.  Patient Active Problem List   Diagnosis Date Noted   CLOSED FRACTURE OF SHAFT OF METACARPAL BONE 03/17/2007    Past Surgical History:  Procedure Laterality Date   TONSILLECTOMY AND ADENOIDECTOMY         Home Medications    Prior to Admission medications   Medication Sig Start Date End Date Taking? Authorizing Provider  ondansetron (ZOFRAN) 4 MG tablet Take 1 tablet (4 mg total) by mouth every 6 (six) hours. 05/26/20   Rennis Harding, PA-C    Family History No family history on file.  Social History Social History   Tobacco Use   Smoking status: Every Day   Smokeless tobacco: Never  Substance Use Topics   Alcohol use: Yes     Allergies   Penicillins   Review of Systems Review of Systems  Constitutional: Negative.   HENT: Negative.    Respiratory: Negative.    Cardiovascular: Negative.   Gastrointestinal: Negative.   Genitourinary:  Positive for dysuria and frequency.  Musculoskeletal: Negative.     Physical Exam Triage Vital Signs ED Triage Vitals [09/29/20 1022]  Enc Vitals Group     BP 101/66     Pulse Rate 60     Resp 18     Temp 98 F (36.7 C)     Temp Source Oral     SpO2 98 %     Weight      Height      Head Circumference      Peak Flow      Pain Score 0     Pain Loc      Pain Edu?      Excl. in GC?    No data found.  Updated Vital Signs BP 101/66 (BP Location: Right Arm)   Pulse 60   Temp 98 F (36.7 C) (Oral)   Resp 18   SpO2 98%   Visual Acuity Right Eye  Distance:   Left Eye Distance:   Bilateral Distance:    Right Eye Near:   Left Eye Near:    Bilateral Near:     Physical Exam Constitutional:      General: He is not in acute distress.    Appearance: He is normal weight. He is not ill-appearing, toxic-appearing or diaphoretic.  Cardiovascular:     Rate and Rhythm: Normal rate.     Heart sounds: No murmur heard.   No friction rub. No gallop.  Pulmonary:     Effort: Pulmonary effort is normal. No respiratory distress.     Breath sounds: Normal breath sounds. No stridor. No wheezing, rhonchi or rales.  Chest:     Chest wall: No tenderness.  Abdominal:     General: Abdomen is flat. There is no distension.     Palpations: There is no mass.     Tenderness: There is no abdominal tenderness. There is no right CVA tenderness, left CVA tenderness, guarding or rebound.     Hernia:  No hernia is present.  Skin:    General: Skin is warm and dry.  Neurological:     General: No focal deficit present.     Mental Status: He is alert and oriented to person, place, and time.     UC Treatments / Results  Labs (all labs ordered are listed, but only abnormal results are displayed) Labs Reviewed  URINE CULTURE  POCT URINALYSIS DIP (MANUAL ENTRY)  CYTOLOGY, (ORAL, ANAL, URETHRAL) ANCILLARY ONLY    EKG   Radiology No results found.  Procedures Procedures (including critical care time)  Medications Ordered in UC Medications - No data to display  Initial Impression / Assessment and Plan / UC Course  I have reviewed the triage vital signs and the nursing notes.  Pertinent labs & imaging results that were available during my care of the patient were reviewed by me and considered in my medical decision making (see chart for details).      Final Clinical Impressions(s) / UC Diagnoses   Final diagnoses:  Dysuria   Discharge Instructions   None    ED Prescriptions   None    PDMP not reviewed this encounter.   Faythe Casa [place], [place] 09/29/20 1039

## 2020-09-29 NOTE — ED Triage Notes (Signed)
Burning with urination and LT flank pain  x 3 days

## 2020-09-30 LAB — CYTOLOGY, (ORAL, ANAL, URETHRAL) ANCILLARY ONLY
Chlamydia: NEGATIVE
Comment: NEGATIVE
Comment: NEGATIVE
Comment: NORMAL
Neisseria Gonorrhea: NEGATIVE
Trichomonas: NEGATIVE

## 2020-09-30 LAB — URINE CULTURE: Culture: NO GROWTH

## 2021-08-24 DIAGNOSIS — F900 Attention-deficit hyperactivity disorder, predominantly inattentive type: Secondary | ICD-10-CM | POA: Diagnosis not present

## 2021-08-24 DIAGNOSIS — Z0000 Encounter for general adult medical examination without abnormal findings: Secondary | ICD-10-CM | POA: Diagnosis not present

## 2021-08-25 ENCOUNTER — Ambulatory Visit (INDEPENDENT_AMBULATORY_CARE_PROVIDER_SITE_OTHER): Payer: BC Managed Care – PPO | Admitting: Urology

## 2021-08-25 ENCOUNTER — Telehealth: Payer: Self-pay

## 2021-08-25 ENCOUNTER — Encounter: Payer: Self-pay | Admitting: Urology

## 2021-08-25 VITALS — BP 147/78 | HR 94

## 2021-08-25 DIAGNOSIS — Z3009 Encounter for other general counseling and advice on contraception: Secondary | ICD-10-CM | POA: Diagnosis not present

## 2021-08-25 MED ORDER — DIAZEPAM 10 MG PO TABS: 10.0000 mg | ORAL_TABLET | Freq: Once | ORAL | 0 refills | Status: AC

## 2021-08-25 NOTE — Progress Notes (Signed)
   08/25/2021 12:12 PM   Kyle Dillon May 30, 1985 [id number]  Referring provider: Ignatius Specking, MD [address] [place],  [place] [address]  Desires sterilization  HPI: Mr Kyle Dillon is a 36yo here for evaluation of vasectomy. He has 1 healthy child. No hx of testicular injury or surgery. No LUTS.    PMH: No past medical history on file.  Surgical History: Past Surgical History:  Procedure Laterality Date   TONSILLECTOMY AND ADENOIDECTOMY      Home Medications:  Allergies as of 08/25/2021       Reactions   Penicillins         Medication List        Accurate as of August 25, 2021 12:12 PM. If you have any questions, ask your nurse or doctor.          Methylphenidate HCl ER (XR) 15 MG Cp24 Take 1 capsule by mouth every morning.   ondansetron 4 MG tablet Commonly known as: ZOFRAN Take 1 tablet (4 mg total) by mouth every 6 (six) hours.        Allergies:  Allergies  Allergen Reactions   Penicillins     Family History: No family history on file.  Social History:  reports that he has been smoking. He has never used smokeless tobacco. He reports current alcohol use. No history on file for drug use.  ROS: All other review of systems were reviewed and are negative except what is noted above in HPI  Physical Exam: BP (!) 147/78   Pulse 94   Constitutional:  Alert and oriented, No acute distress. HEENT: [place] AT, moist mucus membranes.  Trachea midline, no masses. Cardiovascular: No clubbing, cyanosis, or edema. Respiratory: Normal respiratory effort, no increased work of breathing. GI: Abdomen is soft, nontender, nondistended, no abdominal masses GU: No CVA tenderness. Circumcised phallus. No masses/lesions on penis, testis, scrotum. Bilateral vas deferns palpable Lymph: No cervical or inguinal lymphadenopathy. Skin: No rashes, bruises or suspicious lesions. Neurologic: Grossly intact, no focal deficits, moving all 4 extremities. Psychiatric: Normal mood and  affect.  Laboratory Data: No results found for: "WBC", "HGB", "HCT", "MCV", "PLT"  No results found for: "CREATININE"  No results found for: "PSA"  No results found for: "TESTOSTERONE"  No results found for: "HGBA1C"  Urinalysis    Component Value Date/Time   BILIRUBINUR negative 09/29/2020 1028   KETONESUR negative 09/29/2020 1028   PROTEINUR negative 09/29/2020 1028   UROBILINOGEN 0.2 09/29/2020 1028   NITRITE Negative 09/29/2020 1028   LEUKOCYTESUR Negative 09/29/2020 1028    No results found for: "LABMICR", "WBCUA", "RBCUA", "LABEPIT", "MUCUS", "BACTERIA"  Pertinent Imaging:  No results found for this or any previous visit.  No results found for this or any previous visit.  No results found for this or any previous visit.  No results found for this or any previous visit.  No results found for this or any previous visit.  No results found for this or any previous visit.  No results found for this or any previous visit.  No results found for this or any previous visit.   Assessment & Plan:    1. Encounter for vasectomy counseling -Schedule for vasectomy -rx for valium sent to pharmacy   No follow-ups on file.  Wilkie Aye, MD  [facility] [place]

## 2021-08-25 NOTE — Telephone Encounter (Signed)
Open in error

## 2021-09-01 DIAGNOSIS — F902 Attention-deficit hyperactivity disorder, combined type: Secondary | ICD-10-CM | POA: Diagnosis not present

## 2021-10-11 ENCOUNTER — Ambulatory Visit (INDEPENDENT_AMBULATORY_CARE_PROVIDER_SITE_OTHER): Payer: BC Managed Care – PPO | Admitting: Urology

## 2021-10-11 ENCOUNTER — Encounter: Payer: Self-pay | Admitting: Urology

## 2021-10-11 VITALS — BP 128/68 | HR 64

## 2021-10-11 DIAGNOSIS — Z302 Encounter for sterilization: Secondary | ICD-10-CM

## 2021-10-11 DIAGNOSIS — Z9852 Vasectomy status: Secondary | ICD-10-CM

## 2021-10-11 NOTE — Patient Instructions (Signed)
Vasectomy Postoperative Instructions ? ?Please bring back a semen analysis in approximately 3 months.  ?Your semen analysis  will need to be taken to  ?[facility]  [address], [place], [place] [address] ?[phone] ? ? You will be given a sterile specimen cup. Please label the cup with your name, date of birth, date and time of collections.  ?What to Expect ? - slight redness, swelling and scant drainage along the incision ? - mild to moderate discomfort ? - black and blue (bruising) as the tissue heals ? - low grade fever ? - scrotal sensitivity and/or tenderness ?- Edges of the incision may pull apart and heal slowly, sometimes a knot may be present which remains for several months.  This is NORMAL and all part of the healing process. ?- if stitches are placed, they do not need to be removed ?- if you have pain or discomfort immediately after the vasectomy, you may use OTC pain medication for relief , ex: tylenol.  After local anesthetic wears off an ice pack will provide additional comfort and can also prevent swelling if used ? ?Activity ? - no sexual intercourse for at lease 5 days depending on comfort ? - no heavy lifting for 48-72 hours (anything over 5-10 lbs) ? ?Wound Care ? - shower only after 24 hours ? - no tub baths, hot tub, or pools for at least 7 days ? - ice packs for 48 hours: 30 minutes on and 30 minutes off ? ?Problem to Report ? - generalized redness ? - increased pain and swelling ? - fever greater than 101 F ? - significant drainage or bleeding from the wound ? ?TO DO ?- Ejaculations help to clear the passage of sperm, but you must use another from of birth control until you are told you may discontinue its use!! ?- You will be given a specimen cup to bring back a semen sample in 3 months to check and see if its clear of sperm.  Only after the semen is sent for analysis and is reported back as clear should you use this as your primary form of birth control!    ?

## 2021-10-11 NOTE — Progress Notes (Signed)
10/11/21  CC: desires sterilization   HPI: Mr Danielsen is a 36yo here for vasectomy Blood pressure 128/68, pulse 64. NED. A&Ox3.   No respiratory distress   Abd soft, NT, ND Normal external genitalia with patent urethral meatus  A timeout was performed.  Patient's identity and consent was confirmed.  All questions were answered.   Bilateral Vasectomy Procedure  Pre-Procedure: - Patient's scrotum was prepped and draped for vasectomy. - The vas was palpated through the scrotal skin on the left. - 1% Xylocaine was injected into the skin and surrounding tissue for placement  - In a similar manner, the vas on the right was identified, anesthetized, and stabilized.  Procedure: - A sharp hemostat was used to make a small stab incision in the skin overlying the vas - The left vas was isolated and brought up through the incision exposing that structure. - Bleeding points were cauterized as they occurred. - The vas was free from the surrounding structures and brought to the view. - A segment was positioned for placement with a hemostat. - A second hemostat was placed and a small segment between the two hemostats and was removed for inspection. - Each end of the transected vas lumen was fulgurated/ obliterated using needlepoint electrocautery -A fascial interposition was performed on testicular end of the vas using #3-0 chromic suture -The same procedure was performed on the right. - A single suture of #3-0 chromic catgut was used to close each lateral scrotal skin incision - A dressing was applied.  Post-Procedure: - Patient was instructed in care of the operative area - A specimen is to be delivered in 12 weeks   -Another form of contraception is to be used until post vasectomy semen analysis  Nicolette Bang, MD

## 2021-11-17 ENCOUNTER — Ambulatory Visit: Payer: BC Managed Care – PPO | Admitting: Urology

## 2022-01-26 DIAGNOSIS — Z87891 Personal history of nicotine dependence: Secondary | ICD-10-CM | POA: Diagnosis not present

## 2022-01-26 DIAGNOSIS — E663 Overweight: Secondary | ICD-10-CM | POA: Diagnosis not present

## 2022-01-26 DIAGNOSIS — F909 Attention-deficit hyperactivity disorder, unspecified type: Secondary | ICD-10-CM | POA: Diagnosis not present

## 2022-01-26 DIAGNOSIS — Z139 Encounter for screening, unspecified: Secondary | ICD-10-CM | POA: Diagnosis not present

## 2022-01-26 DIAGNOSIS — Z0189 Encounter for other specified special examinations: Secondary | ICD-10-CM | POA: Diagnosis not present

## 2022-01-26 DIAGNOSIS — Z6829 Body mass index (BMI) 29.0-29.9, adult: Secondary | ICD-10-CM | POA: Diagnosis not present

## 2022-02-16 DIAGNOSIS — Z139 Encounter for screening, unspecified: Secondary | ICD-10-CM | POA: Diagnosis not present

## 2022-02-23 DIAGNOSIS — F909 Attention-deficit hyperactivity disorder, unspecified type: Secondary | ICD-10-CM | POA: Diagnosis not present

## 2022-02-23 DIAGNOSIS — E663 Overweight: Secondary | ICD-10-CM | POA: Diagnosis not present

## 2022-02-23 DIAGNOSIS — Z6829 Body mass index (BMI) 29.0-29.9, adult: Secondary | ICD-10-CM | POA: Diagnosis not present

## 2022-02-23 DIAGNOSIS — Z2820 Immunization not carried out because of patient decision for unspecified reason: Secondary | ICD-10-CM | POA: Diagnosis not present

## 2022-02-23 DIAGNOSIS — Z6830 Body mass index (BMI) 30.0-30.9, adult: Secondary | ICD-10-CM | POA: Diagnosis not present

## 2022-02-23 DIAGNOSIS — Z87891 Personal history of nicotine dependence: Secondary | ICD-10-CM | POA: Diagnosis not present

## 2022-06-29 DIAGNOSIS — F909 Attention-deficit hyperactivity disorder, unspecified type: Secondary | ICD-10-CM | POA: Diagnosis not present

## 2022-06-29 DIAGNOSIS — Z0000 Encounter for general adult medical examination without abnormal findings: Secondary | ICD-10-CM | POA: Diagnosis not present

## 2022-11-06 DIAGNOSIS — Z0000 Encounter for general adult medical examination without abnormal findings: Secondary | ICD-10-CM | POA: Diagnosis not present

## 2022-11-12 DIAGNOSIS — F909 Attention-deficit hyperactivity disorder, unspecified type: Secondary | ICD-10-CM | POA: Diagnosis not present

## 2022-11-12 DIAGNOSIS — Z2820 Immunization not carried out because of patient decision for unspecified reason: Secondary | ICD-10-CM | POA: Diagnosis not present

## 2022-11-12 DIAGNOSIS — Z0001 Encounter for general adult medical examination with abnormal findings: Secondary | ICD-10-CM | POA: Diagnosis not present

## 2022-11-12 DIAGNOSIS — Z6829 Body mass index (BMI) 29.0-29.9, adult: Secondary | ICD-10-CM | POA: Diagnosis not present

## 2023-02-21 DIAGNOSIS — F909 Attention-deficit hyperactivity disorder, unspecified type: Secondary | ICD-10-CM | POA: Diagnosis not present

## 2023-04-29 DIAGNOSIS — F909 Attention-deficit hyperactivity disorder, unspecified type: Secondary | ICD-10-CM | POA: Diagnosis not present

## 2023-07-29 DIAGNOSIS — F909 Attention-deficit hyperactivity disorder, unspecified type: Secondary | ICD-10-CM | POA: Diagnosis not present

## 2023-11-11 DIAGNOSIS — Z0000 Encounter for general adult medical examination without abnormal findings: Secondary | ICD-10-CM | POA: Diagnosis not present

## 2023-11-11 DIAGNOSIS — Z131 Encounter for screening for diabetes mellitus: Secondary | ICD-10-CM | POA: Diagnosis not present

## 2023-11-18 DIAGNOSIS — F909 Attention-deficit hyperactivity disorder, unspecified type: Secondary | ICD-10-CM | POA: Diagnosis not present

## 2023-11-18 DIAGNOSIS — Z0000 Encounter for general adult medical examination without abnormal findings: Secondary | ICD-10-CM | POA: Diagnosis not present
